# Patient Record
Sex: Male | Born: 1970 | Race: White | Hispanic: No | Marital: Married | Smoking: Never smoker
Health system: Southern US, Community
[De-identification: ages and names within clinical notes are randomized; demographics above are authoritative.]

## PROBLEM LIST (undated history)

## (undated) DIAGNOSIS — E785 Hyperlipidemia, unspecified: Secondary | ICD-10-CM

## (undated) DIAGNOSIS — I10 Essential (primary) hypertension: Secondary | ICD-10-CM

## (undated) DIAGNOSIS — F419 Anxiety disorder, unspecified: Secondary | ICD-10-CM

## (undated) DIAGNOSIS — K219 Gastro-esophageal reflux disease without esophagitis: Secondary | ICD-10-CM

## (undated) HISTORY — DX: Essential (primary) hypertension: I10

## (undated) HISTORY — DX: Hyperlipidemia, unspecified: E78.5

## (undated) HISTORY — DX: Anxiety disorder, unspecified: F41.9

## (undated) HISTORY — DX: Gastro-esophageal reflux disease without esophagitis: K21.9

## (undated) HISTORY — PX: CHOLECYSTECTOMY: SHX55

---

## 2005-10-05 ENCOUNTER — Other Ambulatory Visit: Payer: Self-pay

## 2005-10-05 ENCOUNTER — Emergency Department: Payer: Self-pay | Admitting: Emergency Medicine

## 2005-10-31 ENCOUNTER — Ambulatory Visit: Payer: Self-pay | Admitting: Unknown Physician Specialty

## 2010-03-11 ENCOUNTER — Emergency Department: Payer: Self-pay | Admitting: Emergency Medicine

## 2010-03-24 ENCOUNTER — Ambulatory Visit: Payer: Self-pay | Admitting: General Surgery

## 2015-07-15 DIAGNOSIS — E781 Pure hyperglyceridemia: Secondary | ICD-10-CM | POA: Insufficient documentation

## 2016-07-17 DIAGNOSIS — Z7185 Encounter for immunization safety counseling: Secondary | ICD-10-CM | POA: Insufficient documentation

## 2016-07-17 DIAGNOSIS — Z7189 Other specified counseling: Secondary | ICD-10-CM | POA: Insufficient documentation

## 2018-01-24 DIAGNOSIS — R3129 Other microscopic hematuria: Secondary | ICD-10-CM | POA: Insufficient documentation

## 2018-02-24 ENCOUNTER — Encounter: Payer: Self-pay | Admitting: Urology

## 2018-02-24 ENCOUNTER — Ambulatory Visit: Payer: BLUE CROSS/BLUE SHIELD | Admitting: Urology

## 2018-02-24 VITALS — BP 141/84 | HR 125 | Ht 67.0 in | Wt 219.7 lb

## 2018-02-24 DIAGNOSIS — R3915 Urgency of urination: Secondary | ICD-10-CM

## 2018-02-24 DIAGNOSIS — R3129 Other microscopic hematuria: Secondary | ICD-10-CM | POA: Diagnosis not present

## 2018-02-24 LAB — URINALYSIS, COMPLETE
BILIRUBIN UA: NEGATIVE
Glucose, UA: NEGATIVE
Ketones, UA: NEGATIVE
Leukocytes, UA: NEGATIVE
Nitrite, UA: NEGATIVE
PH UA: 6 (ref 5.0–7.5)
PROTEIN UA: NEGATIVE
Specific Gravity, UA: <1.005 — ABNORMAL LOW (ref 1.005–1.030)
Urobilinogen, Ur: 0.2 mg/dL (ref 0.2–1.0)

## 2018-02-24 LAB — MICROSCOPIC EXAMINATION
BACTERIA UA: NONE SEEN
EPITHELIAL CELLS (NON RENAL): NONE SEEN /HPF (ref 0–10)
WBC, UA: NONE SEEN /hpf (ref 0–5)

## 2018-02-24 NOTE — Progress Notes (Signed)
02/24/2018 4:01 PM   Garrett York [birth year] [id number]  Referring provider: Marisue Ivan, MD [phone] [facility] [address] [facility] [place], [place] [address]  Chief Complaint  Patient presents with  . Hematuria    HPI:  Consultation for microscopic hematuria.  Patient had a urinalysis March 2019 that showed 4-10 red blood cells per high-powered field.  Today's urinalysis shows 3-10 red blood cells per high-powered field.  Patient has occasional frequency and urgency.  He has some hesitancy and straining at the end of urination. No gross hematuria. No kidney stones. No GU hx or surgery. 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 No chemo or xrt exposure.   PMH: Past Medical History:  Diagnosis Date  . Anxiety   . GERD (gastroesophageal reflux disease)   . Hyperlipidemia   . Hypertension     Surgical History: Past Surgical History:  Procedure Laterality Date  . CHOLECYSTECTOMY      Home Medications:  Allergies as of 02/24/2018      Reactions   Lac Bovis    Other reaction(s): Other (See Comments) Gets bloated, gassy, and upset stomach      Medication List        Accurate as of 02/24/18  4:01 PM. Always use your most recent med list.          gemfibrozil 600 MG tablet Commonly known as:  LOPID Take 600 mg by mouth daily.   hydrochlorothiazide 12.5 MG tablet Commonly known as:  HYDRODIURIL Take by mouth.   ibuprofen 200 MG tablet Commonly known as:  ADVIL,MOTRIN Take by mouth.   metoprolol succinate 50 MG 24 hr tablet Commonly known as:  TOPROL-XL TAKE 1 TABLET (50 MG TOTAL) BY MOUTH ONCE DAILY.       Allergies:  Allergies  Allergen Reactions  . Lac Bovis     Other reaction(s): Other (See Comments) Gets bloated, gassy, and upset stomach    Family History: Family History  Problem Relation Age of Onset  . Prostate cancer Paternal Uncle   . Bladder Cancer Neg Hx   . Kidney cancer Neg  Hx     Social History:  reports that he has never smoked. He has never used smokeless tobacco. He reports that he drank alcohol. He reports that he does not use drugs.  ROS: UROLOGY Frequent Urination?: Yes Hard to postpone urination?: Yes Burning/pain with urination?: No Get up at night to urinate?: Yes Leakage of urine?: No Urine stream starts and stops?: No Trouble starting stream?: No Do you have to strain to urinate?: No Blood in urine?: Yes Urinary tract infection?: No Sexually transmitted disease?: No Injury to kidneys or bladder?: No Painful intercourse?: No Weak stream?: No Erection problems?: No Penile pain?: No  Gastrointestinal Nausea?: No Vomiting?: No Indigestion/heartburn?: Yes Diarrhea?: No Constipation?: No  Constitutional Fever: No Night sweats?: No Weight loss?: No Fatigue?: No  Skin Skin rash/lesions?: No Itching?: No  Eyes Blurred vision?: No Double vision?: No  Ears/Nose/Throat Sore throat?: No Sinus problems?: No  Hematologic/Lymphatic Swollen glands?: No Easy bruising?: No  Cardiovascular Leg swelling?: No Chest pain?: No  Respiratory Cough?: No Shortness of breath?: No  Endocrine Excessive thirst?: No  Musculoskeletal Back pain?: Yes Joint pain?: No  Neurological Headaches?: Yes Dizziness?: No  Psychologic Depression?: No Anxiety?: No  Physical Exam: BP (!) 141/84 (BP Location: Right Arm, Patient Position: Sitting, Cuff Size: Large)   Pulse (!) 125   Ht  (1.702 m)  Wt 99.7 kg (219 lb 11.2 oz)   BMI 34.41 kg/m   Constitutional:  Alert and oriented, No acute distress. HEENT: [place] AT, moist mucus membranes.  Trachea midline, no masses. Cardiovascular: No clubbing, cyanosis, or edema. Respiratory: Normal respiratory effort, no increased work of breathing. GI: Abdomen is soft, nontender, nondistended, no abdominal masses GU: No CVA tenderness Lymph: No cervical or inguinal lymphadenopathy. Skin: No  rashes, bruises or suspicious lesions. Neurologic: Grossly intact, no focal deficits, moving all 4 extremities. Psychiatric: Normal mood and affect. GU: circumcised, no lesions. Testes descended bilaterally and palpably normal DRE: prostate 20 g, benign - no hard area or nodule   Laboratory Data: No results found for: WBC, HGB, HCT, MCV, PLT  No results found for: CREATININE  No results found for: PSA  No results found for: TESTOSTERONE  No results found for: HGBA1C  Urinalysis No results found for: COLORURINE, APPEARANCEUR, LABSPEC, PHURINE, GLUCOSEU, HGBUR, BILIRUBINUR, KETONESUR, PROTEINUR, UROBILINOGEN, NITRITE, LEUKOCYTESUR  No results found for: LABMICR, WBCUA, RBCUA, LABEPIT, MUCUS, BACTERIA  No results found for this or any previous visit. No results found for this or any previous visit. No results found for this or any previous visit. No results found for this or any previous visit. No results found for this or any previous visit. No results found for this or any previous visit. No results found for this or any previous visit. No results found for this or any previous visit.  Assessment & Plan:    1. Microscopic hematuria - discussed nature r/b/a to CT with iv and cystoscopy. He elects to proceed.   2. Urgency, frequency - as above   - Urinalysis, Complete   No follow-ups on file.  Jerilee Field, MD  [facility] [address] [place], [place] [address] [phone]

## 2018-03-11 ENCOUNTER — Ambulatory Visit
Admission: RE | Admit: 2018-03-11 | Discharge: 2018-03-11 | Disposition: A | Discharge status: Home / Self Care | Payer: BLUE CROSS/BLUE SHIELD | Source: Ambulatory Visit | Attending: Urology | Admitting: Urology

## 2018-03-11 DIAGNOSIS — M47816 Spondylosis without myelopathy or radiculopathy, lumbar region: Secondary | ICD-10-CM | POA: Insufficient documentation

## 2018-03-11 DIAGNOSIS — M5136 Other intervertebral disc degeneration, lumbar region: Secondary | ICD-10-CM | POA: Insufficient documentation

## 2018-03-11 DIAGNOSIS — I700 Atherosclerosis of aorta: Secondary | ICD-10-CM | POA: Insufficient documentation

## 2018-03-11 DIAGNOSIS — R3129 Other microscopic hematuria: Secondary | ICD-10-CM | POA: Insufficient documentation

## 2018-03-11 DIAGNOSIS — N4289 Other specified disorders of prostate: Secondary | ICD-10-CM | POA: Diagnosis not present

## 2018-03-11 MED ORDER — IOPAMIDOL (ISOVUE-300) INJECTION 61%
150.0000 mL | Freq: Once | INTRAVENOUS | Status: AC | PRN
  Administered 2018-03-11: 150 mL via INTRAVENOUS

## 2018-04-03 ENCOUNTER — Ambulatory Visit (INDEPENDENT_AMBULATORY_CARE_PROVIDER_SITE_OTHER): Payer: BLUE CROSS/BLUE SHIELD | Admitting: Urology

## 2018-04-03 ENCOUNTER — Encounter: Payer: Self-pay | Admitting: Urology

## 2018-04-03 DIAGNOSIS — R3129 Other microscopic hematuria: Secondary | ICD-10-CM | POA: Diagnosis not present

## 2018-04-03 LAB — MICROSCOPIC EXAMINATION
BACTERIA UA: NONE SEEN
EPITHELIAL CELLS (NON RENAL): NONE SEEN /HPF (ref 0–10)
WBC UA: NONE SEEN /HPF (ref 0–5)

## 2018-04-03 LAB — URINALYSIS, COMPLETE
Bilirubin, UA: NEGATIVE
Glucose, UA: NEGATIVE
Ketones, UA: NEGATIVE
Leukocytes, UA: NEGATIVE
NITRITE UA: NEGATIVE
Protein, UA: NEGATIVE
Specific Gravity, UA: <1.005 — ABNORMAL LOW (ref 1.005–1.030)
Urobilinogen, Ur: 0.2 mg/dL (ref 0.2–1.0)
pH, UA: 6.5 (ref 5.0–7.5)

## 2018-04-03 MED ORDER — CIPROFLOXACIN HCL 500 MG PO TABS
500.0000 mg | ORAL_TABLET | Freq: Once | ORAL | Status: AC
  Administered 2018-04-03: 500 mg via ORAL

## 2018-04-03 MED ORDER — LIDOCAINE HCL URETHRAL/MUCOSAL 2 % EX GEL
1.0000 "application " | Freq: Once | CUTANEOUS | Status: AC
  Administered 2018-04-03: 1 via URETHRAL

## 2018-04-03 NOTE — Progress Notes (Signed)
   04/03/18  CC:  Chief Complaint  Patient presents with  . Cysto    HPI:  F/u -   MH -  Patient had a urinalysis March 2019 that showed 4-10 red blood cells per high-powered field. F/u May 2019 UA showed 3-10 red blood cells per high-powered field.  Patient has occasional frequency and urgency.  He has some hesitancy and straining at the end of urination. No gross hematuria. No kidney stones. No GU hx or surgery. 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 No chemo or xrt exposure.   He returns for cysto. He underwent CT scan which was negative. I reviewed all the images.     There were no vitals taken for this visit. NED. A&Ox3.   No respiratory distress   Abd soft, NT, ND Normal external genitalia with patent urethral meatus  Cystoscopy Procedure Note  Patient identification was confirmed, informed consent was obtained, and patient was prepped using Betadine solution.  Lidocaine jelly was administered per urethral meatus.    Preoperative abx where received prior to procedure.    Procedure: - Flexible cystoscope introduced, without any difficulty.   - Thorough search of the bladder revealed:    normal urethral meatus    Prostate appeared normal     normal urothelium    no stones    no ulcers     no tumors    no urethral polyps    no trabeculation  On retroflexion the bladder neck and bladder appeared normal   - Ureteral orifices were normal in position and appearance.  Post-Procedure: - Patient tolerated the procedure well  Assessment/ Plan:  MH - benign evaluation. We will see in 1 year or sooner if issues. He is cleared for all work and activities from a GU point of view.   Jerilee FieldMatthew Jeyren Danowski, MD

## 2018-04-04 ENCOUNTER — Other Ambulatory Visit: Payer: BLUE CROSS/BLUE SHIELD

## 2018-04-11 ENCOUNTER — Encounter: Payer: Self-pay | Admitting: Urology

## 2018-04-17 ENCOUNTER — Other Ambulatory Visit: Payer: BLUE CROSS/BLUE SHIELD

## 2018-05-26 DIAGNOSIS — R7303 Prediabetes: Secondary | ICD-10-CM | POA: Insufficient documentation

## 2019-03-13 DIAGNOSIS — E6609 Other obesity due to excess calories: Secondary | ICD-10-CM | POA: Insufficient documentation

## 2019-04-15 ENCOUNTER — Encounter: Payer: Self-pay | Admitting: Urology

## 2019-04-15 ENCOUNTER — Other Ambulatory Visit: Payer: Self-pay

## 2019-04-15 ENCOUNTER — Ambulatory Visit: Payer: BC Managed Care – PPO | Admitting: Urology

## 2019-04-15 VITALS — BP 119/73 | HR 109 | Ht 67.0 in | Wt 213.1 lb

## 2019-04-15 DIAGNOSIS — Z87898 Personal history of other specified conditions: Secondary | ICD-10-CM | POA: Insufficient documentation

## 2019-04-15 DIAGNOSIS — I10 Essential (primary) hypertension: Secondary | ICD-10-CM | POA: Insufficient documentation

## 2019-04-15 DIAGNOSIS — Z87448 Personal history of other diseases of urinary system: Secondary | ICD-10-CM | POA: Diagnosis not present

## 2019-04-15 LAB — URINALYSIS, COMPLETE
Bilirubin, UA: NEGATIVE
Glucose, UA: NEGATIVE
Ketones, UA: NEGATIVE
Leukocytes,UA: NEGATIVE
Nitrite, UA: NEGATIVE
Protein,UA: NEGATIVE
Specific Gravity, UA: 1.02 (ref 1.005–1.030)
Urobilinogen, Ur: 0.2 mg/dL (ref 0.2–1.0)
pH, UA: 6 (ref 5.0–7.5)

## 2019-04-15 LAB — MICROSCOPIC EXAMINATION
Bacteria, UA: NONE SEEN
Epithelial Cells (non renal): NONE SEEN /hpf (ref 0–10)
WBC, UA: NONE SEEN /hpf (ref 0–5)

## 2019-04-15 NOTE — Progress Notes (Signed)
04/15/2019 3:53 PM   Garrett York 12/02/1970 [id number]  Referring provider: Marisue IvanLinthavong, Kanhka, MD [phone] [address] [facility] [place],  [place] [id number]  No chief complaint on file.   HPI:  F/u -   MH - Patient had a urinalysis March 2019 that showed 4-10 red blood cells per high-powered field. F/u May 2019 UA showed 3-10 red blood cells per high-powered field. Patient has occasional frequency and urgency. He has some hesitancy and straining at the end of urination. No gross hematuria. No kidney stones. No GU hx or surgery. 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 No chemo or xrt exposure.  Cystoscopy was normal on 6/13 2019. He underwent CT scan which was negative 03/11/2018.    He returns today and has had no gross hematuria. No flank pain. No dysuria. He doesn't drink a lot of water. Ua with 3-10 rbcs per hpf.   His uncle had PCa.   PMH: Past Medical History:  Diagnosis Date  . Anxiety   . GERD (gastroesophageal reflux disease)   . Hyperlipidemia   . Hypertension     Surgical History: Past Surgical History:  Procedure Laterality Date  . CHOLECYSTECTOMY      Home Medications:  Allergies as of 04/15/2019      Reactions   Lac Bovis    Other reaction(s): Other (See Comments) Gets bloated, gassy, and upset stomach      Medication List       Accurate as of April 15, 2019  3:53 PM. If you have any questions, ask your nurse or doctor.        gemfibrozil 600 MG tablet Commonly known as: LOPID Take 600 mg by mouth daily.   hydrochlorothiazide 12.5 MG tablet Commonly known as: HYDRODIURIL Take by mouth.   ibuprofen 200 MG tablet Commonly known as: ADVIL Take by mouth.   metoprolol succinate 50 MG 24 hr tablet Commonly known as: TOPROL-XL TAKE 1 TABLET (50 MG TOTAL) BY MOUTH ONCE DAILY.       Allergies:  Allergies  Allergen Reactions  . Lac Bovis     Other reaction(s):  Other (See Comments) Gets bloated, gassy, and upset stomach    Family History: Family History  Problem Relation Age of Onset  . Prostate cancer Paternal Uncle   . Bladder Cancer Neg Hx   . Kidney cancer Neg Hx     Social History:  reports that he has never smoked. He has never used smokeless tobacco. He reports previous alcohol use. He reports that he does not use drugs.  ROS:                                        Physical Exam: There were no vitals taken for this visit.  Constitutional:  Alert and oriented, No acute distress. HEENT: [place] AT, moist mucus membranes.  Trachea midline, no masses. Cardiovascular: No clubbing, cyanosis, or edema. Respiratory: Normal respiratory effort, no increased work of breathing. GI: Abdomen is soft, nontender, nondistended, no abdominal masses Back: No CVA tenderness Lymph: No cervical or inguinal lymphadenopathy. Skin: No rashes, bruises or suspicious lesions. Neurologic: Grossly intact, no focal deficits, moving all 4 extremities. Psychiatric: Normal mood and affect. GU: Penis circumcised, normal foreskin, testicles descended bilaterally and palpably normal, bilateral epididymis palpably normal, scrotum normal DRE: Prostate 20 g, smooth without hard area or nodule  Laboratory  Data: No results found for: WBC, HGB, HCT, MCV, PLT  No results found for: CREATININE  No results found for: PSA  No results found for: TESTOSTERONE  No results found for: HGBA1C  Urinalysis    Component Value Date/Time   APPEARANCEUR Clear 04/03/2018 1534   GLUCOSEU Negative 04/03/2018 1534   BILIRUBINUR Negative 04/03/2018 1534   PROTEINUR Negative 04/03/2018 1534   NITRITE Negative 04/03/2018 1534   LEUKOCYTESUR Negative 04/03/2018 1534    Lab Results  Component Value Date   LABMICR See below: 04/03/2018   WBCUA None seen 04/03/2018   RBCUA 3-10 (A) 04/03/2018   LABEPIT None seen 04/03/2018   MUCUS Present (A) 02/24/2018    BACTERIA None seen 04/03/2018    Pertinent Imaging: CT No results found for this or any previous visit. No results found for this or any previous visit. No results found for this or any previous visit. No results found for this or any previous visit. No results found for this or any previous visit. No results found for this or any previous visit. No results found for this or any previous visit. No results found for this or any previous visit.  Assessment & Plan:    1. History of hematuria Normal exam today. UA stable. Discussed PSa and he wanted to hold off. DRE normal.  - Urinalysis, Complete   No follow-ups on file.  Festus Aloe, MD  [facility] [address] [place], [place] [address] [phone]

## 2019-04-15 NOTE — Patient Instructions (Signed)
Hematuria, Adult  Hematuria is blood in the urine. Blood may be visible in the urine, or it may be identified with a test. This condition can be caused by infections of the bladder, urethra, kidney, or prostate. Other possible causes include:   Kidney stones.   Cancer of the urinary tract.   Too much calcium in the urine.   Conditions that are passed from parent to child (inherited conditions).   Exercise that requires a lot of energy.  Infections can usually be treated with medicine, and a kidney stone usually will pass through your urine. If neither of these is the cause of your hematuria, more tests may be needed to identify the cause of your symptoms.  It is very important to tell your health care provider about any blood in your urine, even if it is painless or the blood stops without treatment. Blood in the urine, when it happens and then stops and then happens again, can be a symptom of a very serious condition, including cancer. There is no pain in the initial stages of many urinary cancers.  Follow these instructions at home:  Medicines   Take over-the-counter and prescription medicines only as told by your health care provider.   If you were prescribed an antibiotic medicine, take it as told by your health care provider. Do not stop taking the antibiotic even if you start to feel better.  Eating and drinking   Drink enough fluid to keep your urine clear or pale yellow. It is recommended that you drink 3-4 quarts (2.8-3.8 L) a day. If you have been diagnosed with an infection, it is recommended that you drink cranberry juice in addition to large amounts of water.   Avoid caffeine, tea, and carbonated beverages. These tend to irritate the bladder.   Avoid alcohol because it may irritate the prostate (men).  General instructions   If you have been diagnosed with a kidney stone, follow your health care provider's instructions about straining your urine to catch the stone.   Empty your bladder  often. Avoid holding urine for long periods of time.   If you are male:  ? After a bowel movement, wipe from front to back and use each piece of toilet paper only once.  ? Empty your bladder before and after sex.   Pay attention to any changes in your symptoms. Tell your health care provider about any changes or any new symptoms.   It is your responsibility to get your test results. Ask your health care provider, or the department performing the test, when your results will be ready.   Keep all follow-up visits as told by your health care provider. This is important.  Contact a health care provider if:   You develop back pain.   You have a fever.   You have nausea or vomiting.   Your symptoms do not improve after 3 days.   Your symptoms get worse.  Get help right away if:   You develop severe vomiting and are unable take medicine without vomiting.   You develop severe pain in your back or abdomen even though you are taking medicine.   You pass a large amount of blood in your urine.   You pass blood clots in your urine.   You feel very weak or like you might faint.   You faint.  Summary   Hematuria is blood in the urine. It has many possible causes.   It is very important that you tell   your health care provider about any blood in your urine, even if it is painless or the blood stops without treatment.   Take over-the-counter and prescription medicines only as told by your health care provider.   Drink enough fluid to keep your urine clear or pale yellow.  This information is not intended to replace advice given to you by your health care provider. Make sure you discuss any questions you have with your health care provider.  Document Released: 10/08/2005 Document Revised: 11/10/2016 Document Reviewed: 11/10/2016  Elsevier Interactive Patient Education  2019 Elsevier Inc.

## 2019-04-16 ENCOUNTER — Ambulatory Visit: Payer: BLUE CROSS/BLUE SHIELD

## 2019-06-19 IMAGING — CT CT ABD-PEL WO/W CM
2 of 8 series · 12 of 46 positions shown, 18 images · IV contrast (iopamidol)
Comparison: Report from CT scan dated 03/18/2006. Abdominal
ultrasound dated 10/31/2005

CLINICAL DATA: Microscopic hematuria.  No current symptoms.

EXAM:
CT ABDOMEN AND PELVIS WITHOUT AND WITH CONTRAST
TECHNIQUE: Multidetector CT imaging of the abdomen and pelvis was performed
following the standard protocol before and following the bolus
administration of intravenous contrast.
CONTRAST:  150mL [id number] IOPAMIDOL ([id number]) INJECTION 61%

[Series 10: axial delay prone · axial · delayed · 0.75mm/px · z∈[-1460,-1035]mm · 9 of 105 slices shown, 15 images]
[im 10/105  soft-tissue]
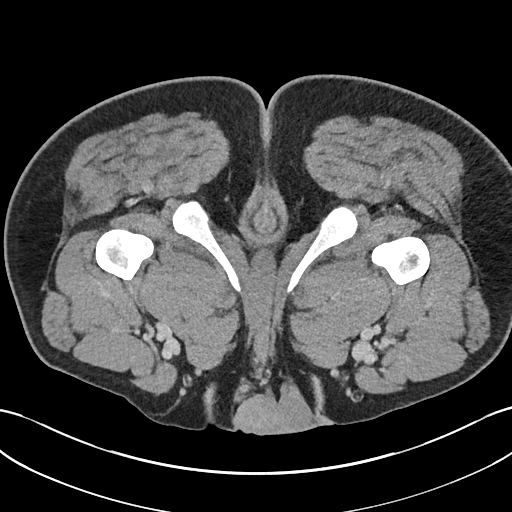
[im 10/105  bone]
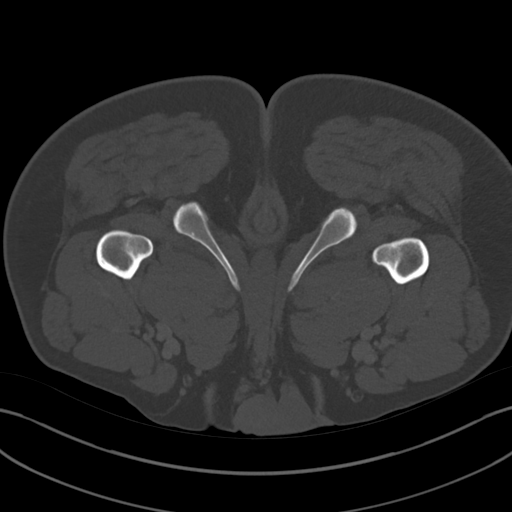
[im 19/105  soft-tissue]
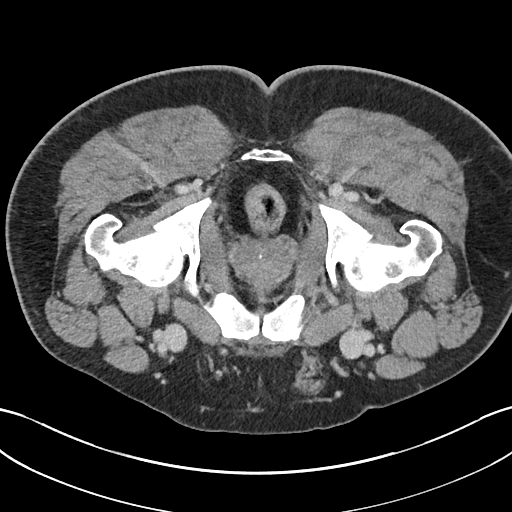
[im 29/105  soft-tissue]
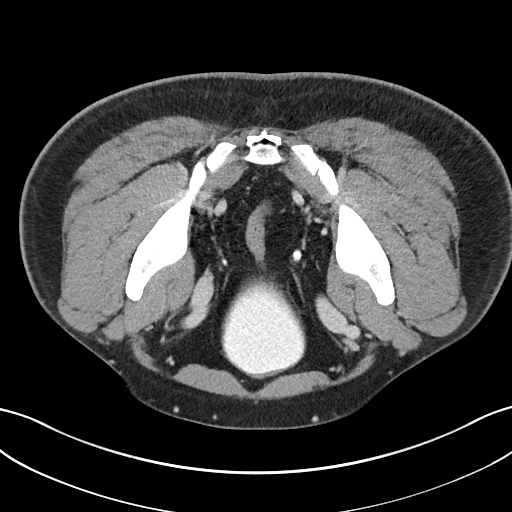
[im 38/105  soft-tissue]
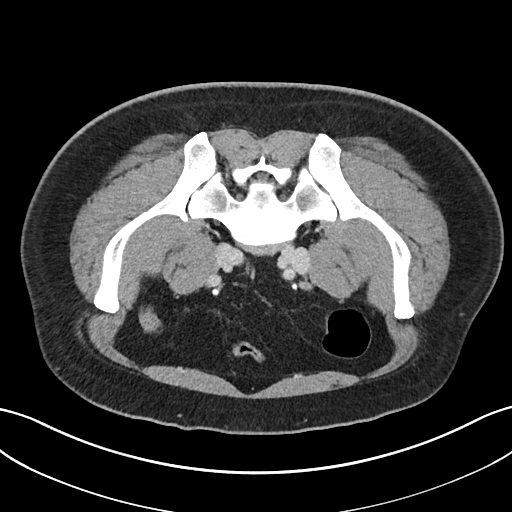
[im 57/105  soft-tissue]
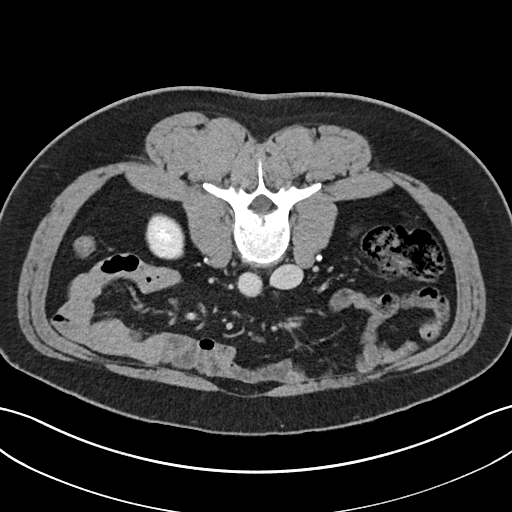
[im 67/105  soft-tissue]
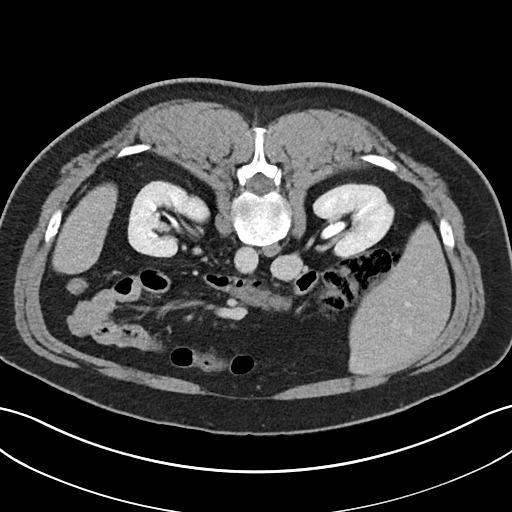
[im 67/105  lung]
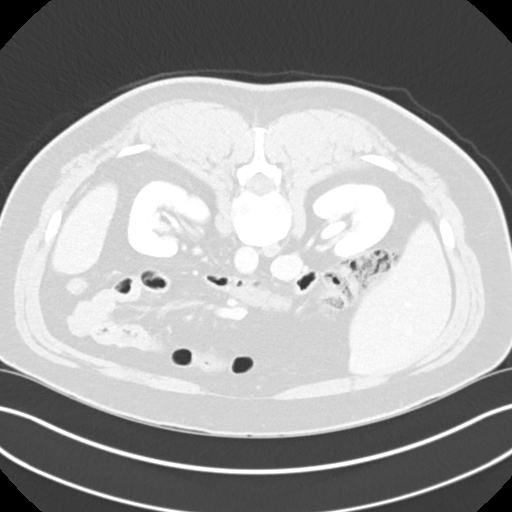
[im 76/105  soft-tissue]
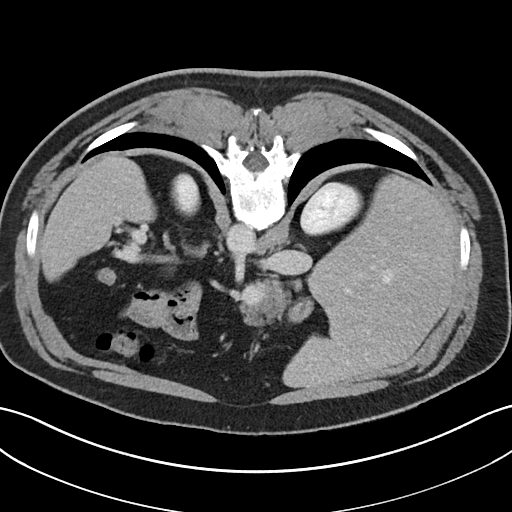
[im 76/105  lung]
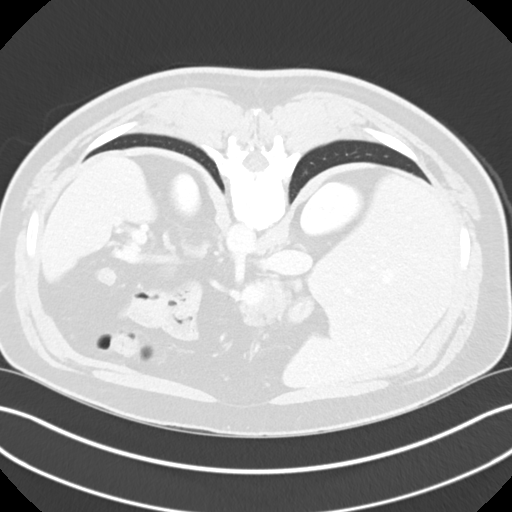
[im 86/105  soft-tissue]
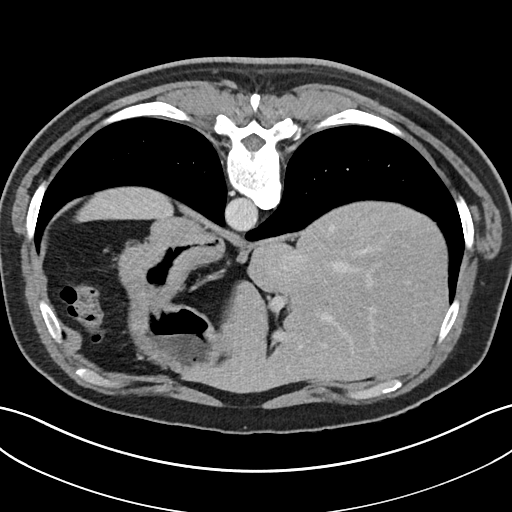
[im 86/105  lung]
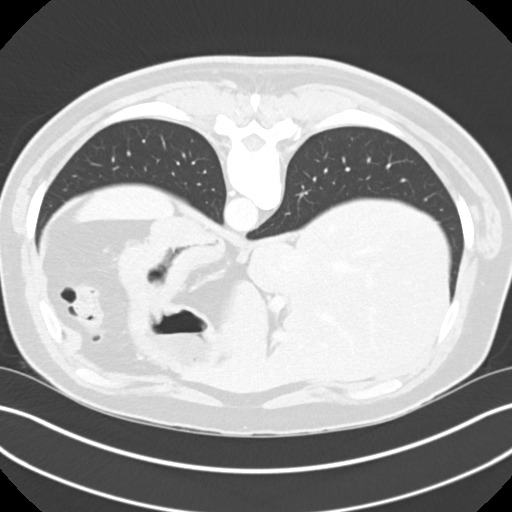
[im 95/105  soft-tissue]
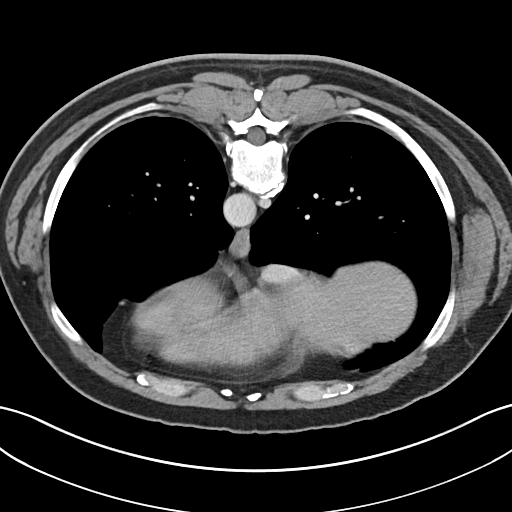
[im 95/105  lung]
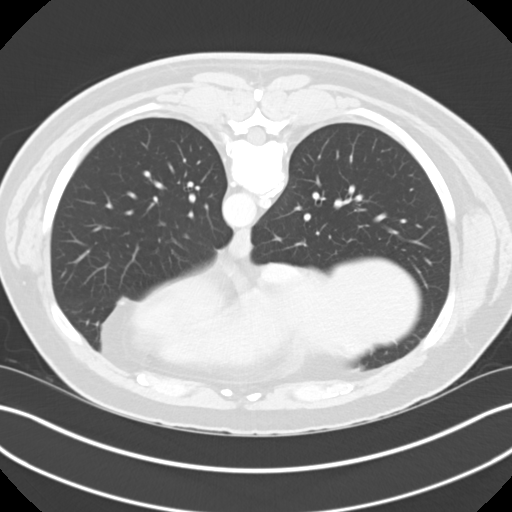
[im 95/105  bone]
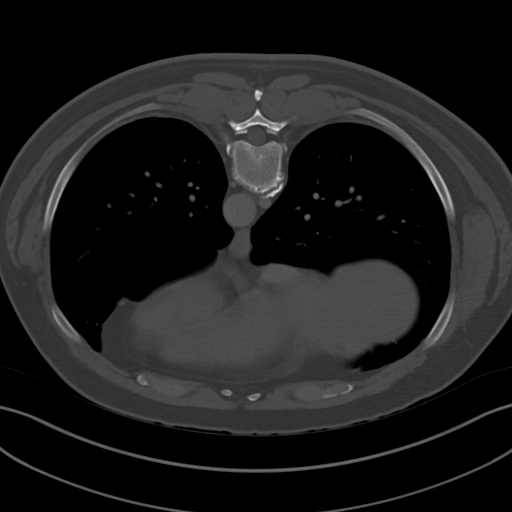

[Series 14: cor delay prone · coronal · delayed · 0.75mm/px · 3 of 190 slices shown]
[im 38/190  soft-tissue]
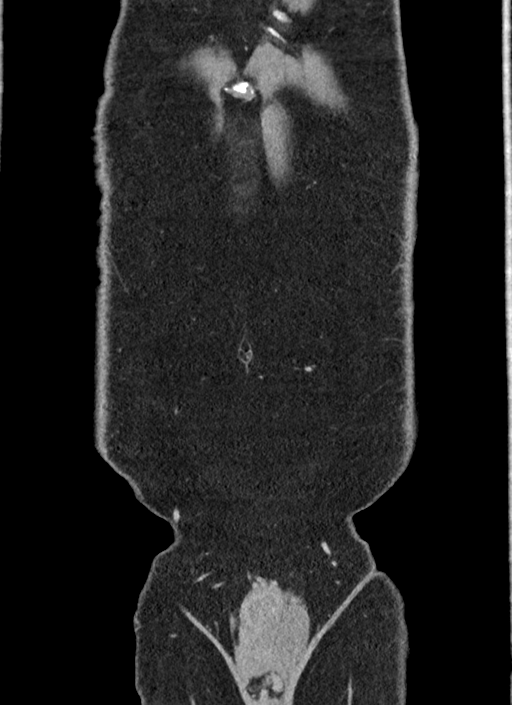
[im 76/190  soft-tissue]
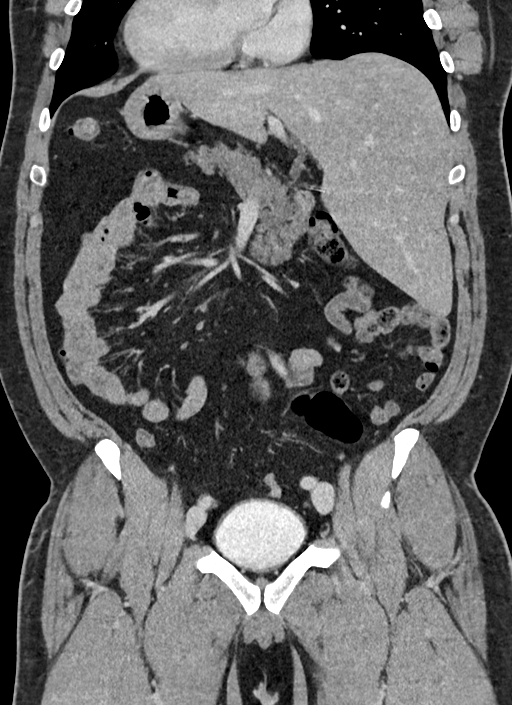
[im 114/190  soft-tissue]
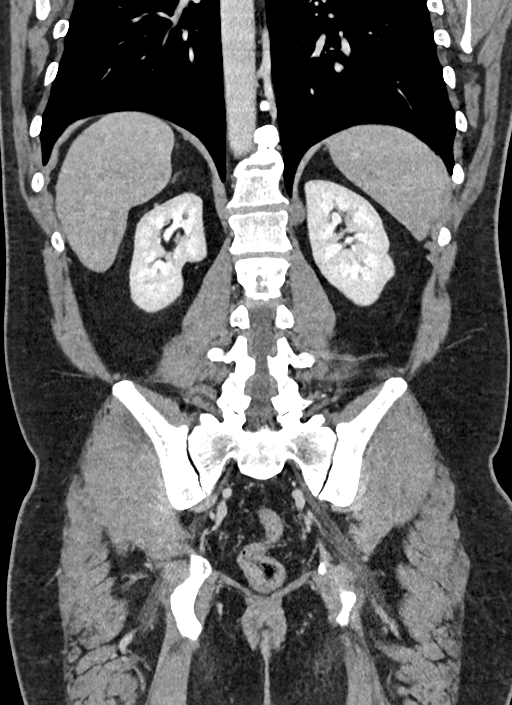

[12 of 46 positions shown; findings below may reference images not displayed]

FINDINGS: Lower chest: Unremarkable

Hepatobiliary: Prior cholecystectomy.  Otherwise unremarkable.

Pancreas: Unremarkable

Spleen: Unremarkable

Adrenals/Urinary Tract: Adrenal glands normal. No urinary tract
calculi, abnormal renal parenchymal enhancement, or abnormal
urographic phase filling defect along the urothelium to explain the
patient's hematuria.

Stomach/Bowel: Unremarkable

Vascular/Lymphatic: Mild abdominal aortic atherosclerotic
calcification. No pathologic adenopathy identified.

Reproductive: Punctate calcification centrally in the prostate gland
on image 88/10, probably incidental.

Other: No supplemental non-categorized findings.

Musculoskeletal: Mild lumbar spondylosis and degenerative disc
disease.
IMPRESSION: 1. A cause for hematuria is not identified.
2.  Aortic Atherosclerosis ([id number]).
3. Punctate calcifications centrally in the prostate gland, likely
incidental.
4. Mild lumbar spondylosis and degenerative disc disease.

## 2020-04-15 ENCOUNTER — Encounter: Payer: Self-pay | Admitting: Urology

## 2020-04-15 ENCOUNTER — Ambulatory Visit: Payer: BC Managed Care – PPO | Admitting: Urology

## 2020-04-15 ENCOUNTER — Other Ambulatory Visit: Payer: Self-pay

## 2020-04-15 VITALS — BP 134/89 | HR 86 | Ht 67.0 in | Wt 218.0 lb

## 2020-04-15 DIAGNOSIS — R3129 Other microscopic hematuria: Secondary | ICD-10-CM | POA: Diagnosis not present

## 2020-04-15 DIAGNOSIS — Z125 Encounter for screening for malignant neoplasm of prostate: Secondary | ICD-10-CM

## 2020-04-15 NOTE — Patient Instructions (Signed)
Prostate Cancer Screening  Prostate cancer screening is a test that is done to check for the presence of prostate cancer in men. The prostate gland is a walnut-sized gland that is located below the bladder and in front of the rectum in males. The function of the prostate is to add fluid to semen during ejaculation. Prostate cancer is the second most common type of cancer in men. Who should have prostate cancer screening?  Screening recommendations vary based on age and other risk factors. Screening is recommended if:  You are older than age 55. If you are age 55-69, talk with your health care provider about your need for screening and how often screening should be done. Because most prostate cancers are slow growing and will not cause death, screening is generally reserved in this age group for men who have a 10-15-year life expectancy.  You are younger than age 55, and you have these risk factors: ? Being a black male or a male of African descent. ? Having a father, brother, or uncle who has been diagnosed with prostate cancer. The risk is higher if your family member's cancer occurred at an early age. Screening is not recommended if:  You are younger than age 40.  You are between the ages of 40 and 54 and you have no risk factors.  You are 70 years of age or older. At this age, the risks that screening can cause are greater than the benefits that it may provide. If you are at high risk for prostate cancer, your health care provider may recommend that you have screenings more often or that you start screening at a younger age. How is screening for prostate cancer done? The recommended prostate cancer screening test is a blood test called the prostate-specific antigen (PSA) test. PSA is a protein that is made in the prostate. As you age, your prostate naturally produces more PSA. Abnormally high PSA levels may be caused by:  Prostate cancer.  An enlarged prostate that is not caused by cancer  (benign prostatic hyperplasia, BPH). This condition is very common in older men.  A prostate gland infection (prostatitis). Depending on the PSA results, you may need more tests, such as:  A physical exam to check the size of your prostate gland.  Blood and imaging tests.  A procedure to remove tissue samples from your prostate gland for testing (biopsy). What are the benefits of prostate cancer screening?  Screening can help to identify cancer at an early stage, before symptoms start and when the cancer can be treated more easily.  There is a small chance that screening may lower your risk of dying from prostate cancer. The chance is small because prostate cancer is a slow-growing cancer, and most men with prostate cancer die from a different cause. What are the risks of prostate cancer screening? The main risk of prostate cancer screening is diagnosing and treating prostate cancer that would never have caused any symptoms or problems. This is called overdiagnosisand overtreatment. PSA screening cannot tell you if your PSA is high due to cancer or a different cause. A prostate biopsy is the only procedure to diagnose prostate cancer. Even the results of a biopsy may not tell you if your cancer needs to be treated. Slow-growing prostate cancer may not need any treatment other than monitoring, so diagnosing and treating it may cause unnecessary stress or other side effects. A prostate biopsy may also cause:  Infection or fever.  A false negative. This is   a result that shows that you do not have prostate cancer when you actually do have prostate cancer. Questions to ask your health care provider  When should I start prostate cancer screening?  What is my risk for prostate cancer?  How often do I need screening?  What type of screening tests do I need?  How do I get my test results?  What do my results mean?  Do I need treatment? Where to find more information  The American Cancer  Society: www.cancer.org  American Urological Association: www.auanet.org Contact a health care provider if:  You have difficulty urinating.  You have pain when you urinate or ejaculate.  You have blood in your urine or semen.  You have pain in your back or in the area of your prostate. Summary  Prostate cancer is a common type of cancer in men. The prostate gland is located below the bladder and in front of the rectum. This gland adds fluid to semen during ejaculation.  Prostate cancer screening may identify cancer at an early stage, when the cancer can be treated more easily.  The prostate-specific antigen (PSA) test is the recommended screening test for prostate cancer.  Discuss the risks and benefits of prostate cancer screening with your health care provider. If you are age 70 or older, the risks that screening can cause are greater than the benefits that it may provide. This information is not intended to replace advice given to you by your health care provider. Make sure you discuss any questions you have with your health care provider. Document Revised: 05/21/2019 Document Reviewed: 05/21/2019 Elsevier Patient Education  2020 Elsevier Inc.  

## 2020-04-15 NOTE — Progress Notes (Signed)
04/15/2020 10:34 AM   Garrett York December 01, 1970 [id number]  Referring provider: Dion Body, MD [facility] [place],  [place] [address]  Chief Complaint  Patient presents with  . Hematuria    HPI:  F/u -   1) MH -Patient had a urinalysis March 2019 that showed 4-10 red blood cells per high-powered field.F/u May 2019 UA showed3-10 red blood cells per high-powered field. Patient has occasional frequency and urgency. He has some hesitancy and straining at the end of urination. No gross hematuria. No kidney stones. No GU hx or surgery. 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 No chemo or xrt exposure.  Cystoscopy was normal on 06/19 and CT scan which was negative 05/19. F/u UA with 3-10 rbcs. No gross.   2) PCa screening - uncle had PCa around age 61. Normal DRE in 2020 - declined PSA.   Garrett York returns today and is doing well. He voids with a good flow. No bothersome freq or urgency. He drinks a lot of soda. No gross hematuria.    PMH: Past Medical History:  Diagnosis Date  . Anxiety   . GERD (gastroesophageal reflux disease)   . Hyperlipidemia   . Hypertension     Surgical History: Past Surgical History:  Procedure Laterality Date  . CHOLECYSTECTOMY      Home Medications:  Allergies as of 04/15/2020      Reactions   Lac Bovis    Other reaction(s): Other (See Comments) Gets bloated, gassy, and upset stomach      Medication List       Accurate as of April 15, 2020 10:34 AM. If you have any questions, ask your nurse or doctor.        calcium carbonate 750 MG chewable tablet Commonly known as: TUMS EX Chew by mouth.   gemfibrozil 600 MG tablet Commonly known as: LOPID Take 600 mg by mouth daily.   hydrochlorothiazide 12.5 MG tablet Commonly known as: HYDRODIURIL Take by mouth.   ibuprofen 200 MG tablet Commonly known as: ADVIL Take by mouth.     metoprolol succinate 50 MG 24 hr tablet Commonly known as: TOPROL-XL TAKE 1 TABLET (50 MG TOTAL) BY MOUTH ONCE DAILY.       Allergies:  Allergies  Allergen Reactions  . Lac Bovis     Other reaction(s): Other (See Comments) Gets bloated, gassy, and upset stomach    Family History: Family History  Problem Relation Age of Onset  . Prostate cancer Paternal Uncle   . Bladder Cancer Neg Hx   . Kidney cancer Neg Hx     Social History:  reports that he has never smoked. He has never used smokeless tobacco. He reports previous alcohol use. He reports that he does not use drugs.   Physical Exam: BP 134/89 (BP Location: Left Arm, Patient Position: Sitting, Cuff Size: Large)   Pulse 86   Ht 5\' 7"  (1.702 m)   Wt 218 lb (98.9 kg)   BMI 34.14 kg/m   Constitutional:  Alert and oriented, No acute distress. HEENT: [place] AT, moist mucus membranes.  Trachea midline, no masses. Cardiovascular: No clubbing, cyanosis, or edema. Respiratory: Normal respiratory effort, no increased work of breathing. GI: Abdomen is soft, nontender, nondistended, no abdominal masses GU: No CVA tenderness, DRE - 20 g prostate, benign  Lymph: No cervical or inguinal lymphadenopathy. Skin: No rashes, bruises or suspicious lesions. Neurologic: Grossly intact, no focal deficits, moving all 4 extremities. Psychiatric:  Normal mood and affect.  Laboratory Data: No results found for: WBC, HGB, HCT, MCV, PLT  No results found for: CREATININE  No results found for: PSA  No results found for: TESTOSTERONE  No results found for: HGBA1C  Urinalysis    Component Value Date/Time   APPEARANCEUR Clear 04/15/2019 1553   GLUCOSEU Negative 04/15/2019 1553   BILIRUBINUR Negative 04/15/2019 1553   PROTEINUR Negative 04/15/2019 1553   NITRITE Negative 04/15/2019 1553   LEUKOCYTESUR Negative 04/15/2019 1553    Lab Results  Component Value Date   LABMICR See below: 04/15/2019   WBCUA None seen 04/15/2019   RBCUA  3-10 (A) 04/03/2018   LABEPIT None seen 04/15/2019   MUCUS Present (A) 02/24/2018   BACTERIA None seen 04/15/2019    Pertinent Imaging: n/a No results found for this or any previous visit.  No results found for this or any previous visit.  No results found for this or any previous visit.  No results found for this or any previous visit.  No results found for this or any previous visit.  No results found for this or any previous visit.  No results found for this or any previous visit.  No results found for this or any previous visit.   Assessment & Plan:    1. MH - benign exam, no worrisome. Will monitor.   2. PCa screen - normal DRE. PSA sent.  - Urinalysis, Complete   No follow-ups on file.  Jerilee Field, MD  [facility] [address] [place], [place] [address] [phone]

## 2020-04-16 LAB — PSA: Prostate Specific Ag, Serum: 0.7 ng/mL (ref 0.0–4.0)

## 2020-04-18 LAB — URINALYSIS, COMPLETE
Bilirubin, UA: NEGATIVE
Glucose, UA: NEGATIVE
Ketones, UA: NEGATIVE
Leukocytes,UA: NEGATIVE
Nitrite, UA: NEGATIVE
Protein,UA: NEGATIVE
Specific Gravity, UA: 1.015 (ref 1.005–1.030)
Urobilinogen, Ur: 0.2 mg/dL (ref 0.2–1.0)
pH, UA: 7 (ref 5.0–7.5)

## 2020-04-18 LAB — MICROSCOPIC EXAMINATION
Bacteria, UA: NONE SEEN
Epithelial Cells (non renal): NONE SEEN /hpf (ref 0–10)

## 2020-04-22 ENCOUNTER — Telehealth: Payer: Self-pay | Admitting: *Deleted

## 2020-04-22 NOTE — Telephone Encounter (Addendum)
  Patient informed, voiced understanding.   ----- Message from Jerilee Field, MD sent at 04/21/2020 12:06 PM EDT ----- Let Cristal Deer know his PSA is normal -- looks good. PSA less than one is ideal. Thanks.  ----- Message ----- From: Sarita Bottom, CMA Sent: 04/18/2020   1:39 PM EDT To: Jerilee Field, MD   ----- Message ----- From: Interface, [facility] Lab Results In Sent: 04/16/2020   5:38 AM EDT To: Jennette Kettle [facility]

## 2021-04-28 ENCOUNTER — Ambulatory Visit: Payer: Self-pay | Admitting: Urology

## 2021-05-05 ENCOUNTER — Ambulatory Visit: Payer: BC Managed Care – PPO | Admitting: Urology

## 2021-05-19 ENCOUNTER — Other Ambulatory Visit: Payer: Self-pay

## 2021-05-19 ENCOUNTER — Encounter: Payer: Self-pay | Admitting: Urology

## 2021-05-19 ENCOUNTER — Ambulatory Visit: Payer: BC Managed Care – PPO | Admitting: Urology

## 2021-05-19 VITALS — BP 144/84 | HR 92 | Ht 67.0 in | Wt 216.0 lb

## 2021-05-19 DIAGNOSIS — R3129 Other microscopic hematuria: Secondary | ICD-10-CM

## 2021-05-19 LAB — URINALYSIS, COMPLETE
Bilirubin, UA: NEGATIVE
Glucose, UA: NEGATIVE
Ketones, UA: NEGATIVE
Leukocytes,UA: NEGATIVE
Nitrite, UA: NEGATIVE
Protein,UA: NEGATIVE
Specific Gravity, UA: 1.015 (ref 1.005–1.030)
Urobilinogen, Ur: 0.2 mg/dL (ref 0.2–1.0)
pH, UA: 5.5 (ref 5.0–7.5)

## 2021-05-19 LAB — MICROSCOPIC EXAMINATION: Bacteria, UA: NONE SEEN

## 2021-05-19 NOTE — Progress Notes (Signed)
05/19/2021 12:28 PM   Garrett York 05-17-1971 [id number]  Referring provider: Marisue Ivan, MD [phone] [facility] [address] [facility] [place],  [place] [address]  Chief Complaint  Patient presents with   Other    Urologic history: 1.  Low risk hematuria 3-10 RBC 02/2018 CT urogram/cystoscopy unremarkable   HPI: 50 y.o. male presents for annual follow-up.  Previously seen by Dr. Mena Goes for microhematuria and family history of prostate cancer Since last years visit he has done well. No bothersome LUTS, denies gross hematuria No flank, abdominal or pelvic pain   PMH: Past Medical History:  Diagnosis Date   Anxiety    GERD (gastroesophageal reflux disease)    Hyperlipidemia    Hypertension     Surgical History: Past Surgical History:  Procedure Laterality Date   CHOLECYSTECTOMY      Home Medications:  Allergies as of 05/19/2021       Reactions   Lac Bovis    Other reaction(s): Other (See Comments) Gets bloated, gassy, and upset stomach        Medication List        Accurate as of May 19, 2021 12:28 PM. If you have any questions, ask your nurse or doctor.          calcium carbonate 750 MG chewable tablet Commonly known as: TUMS EX Chew by mouth.   gemfibrozil 600 MG tablet Commonly known as: LOPID Take 600 mg by mouth daily.   hydrochlorothiazide 12.5 MG tablet Commonly known as: HYDRODIURIL Take by mouth.   ibuprofen 200 MG tablet Commonly known as: ADVIL Take by mouth.   metoprolol succinate 50 MG 24 hr tablet Commonly known as: TOPROL-XL TAKE 1 TABLET (50 MG TOTAL) BY MOUTH ONCE DAILY.        Allergies:  Allergies  Allergen Reactions   Lac Bovis     Other reaction(s): Other (See Comments) Gets bloated, gassy, and upset stomach    Family History: Family History  Problem Relation Age of Onset   Prostate cancer Paternal Uncle    Bladder Cancer Neg Hx    Kidney cancer Neg Hx     Social History:   reports that he has never smoked. He has never used smokeless tobacco. He reports previous alcohol use. He reports that he does not use drugs.   Physical Exam: BP (!) 144/84   Pulse 92   Ht 5\' 7"  (1.702 m)   Wt 216 lb (98 kg)   BMI 33.83 kg/m   Constitutional:  Alert and oriented, No acute distress. HEENT: [place] AT, moist mucus membranes.  Trachea midline, no masses. Cardiovascular: No clubbing, cyanosis, or edema. Respiratory: Normal respiratory effort, no increased work of breathing. Neurologic: Grossly intact, no focal deficits, moving all 4 extremities. Psychiatric: Normal mood and affect.  Laboratory Data:  Urinalysis pending  Assessment & Plan:    1. Microhematuria Negative evaluation in 2019 for low risk microhematuria Urinalysis today If UA today shows no significant increase in microhematuria he sees his PCP twice yearly and would recommend annual urinalysis and to return for any increasing amounts of hematuria  2.  Prostate cancer screening He does not have a first-degree relative diagnosed with prostate cancer and we discussed he is not at increased risk from the general population The prostate cancer screening guidelines were discussed of annual PSA/DRE between the ages of 23-69   51-73, MD  [facility] [address] [place], [place] [phone]

## 2021-05-20 ENCOUNTER — Telehealth: Payer: Self-pay | Admitting: Urology

## 2021-05-20 NOTE — Telephone Encounter (Signed)
Urinalysis showed 11-30 RBCs which is an increased amount from his prior levels.  At office visit discussed having him follow-up as needed however would monitor this a little longer.  Please schedule a lab visit for a repeat urinalysis in 4-6 months

## 2021-05-22 ENCOUNTER — Encounter: Payer: Self-pay | Admitting: *Deleted

## 2021-10-18 ENCOUNTER — Other Ambulatory Visit: Payer: Self-pay | Admitting: *Deleted

## 2021-10-18 DIAGNOSIS — R3129 Other microscopic hematuria: Secondary | ICD-10-CM

## 2021-10-19 ENCOUNTER — Other Ambulatory Visit: Payer: BC Managed Care – PPO

## 2021-10-19 ENCOUNTER — Other Ambulatory Visit: Payer: Self-pay

## 2021-10-19 ENCOUNTER — Encounter: Payer: Self-pay | Admitting: Urology

## 2021-10-19 DIAGNOSIS — R3129 Other microscopic hematuria: Secondary | ICD-10-CM

## 2021-10-19 LAB — URINALYSIS, COMPLETE
Bilirubin, UA: NEGATIVE
Glucose, UA: NEGATIVE
Ketones, UA: NEGATIVE
Leukocytes,UA: NEGATIVE
Nitrite, UA: NEGATIVE
Protein,UA: NEGATIVE
Specific Gravity, UA: 1.015 (ref 1.005–1.030)
Urobilinogen, Ur: 0.2 mg/dL (ref 0.2–1.0)
pH, UA: 6.5 (ref 5.0–7.5)

## 2021-10-19 LAB — MICROSCOPIC EXAMINATION
Bacteria, UA: NONE SEEN
Epithelial Cells (non renal): NONE SEEN /hpf (ref 0–10)
